# Patient Record
Sex: Female | Born: 1965 | Race: White | Hispanic: No
Health system: Southern US, Community
[De-identification: ages and names within clinical notes are randomized; demographics above are authoritative.]

---

## 2000-07-30 ENCOUNTER — Ambulatory Visit (HOSPITAL_COMMUNITY): Admission: RE | Admit: 2000-07-30 | Discharge: 2000-07-30 | Payer: Self-pay | Admitting: Family Medicine

## 2000-07-30 ENCOUNTER — Encounter: Payer: Self-pay | Admitting: Family Medicine

## 2003-05-05 ENCOUNTER — Ambulatory Visit (HOSPITAL_COMMUNITY): Admission: RE | Admit: 2003-05-05 | Discharge: 2003-05-05 | Payer: Self-pay | Admitting: Obstetrics and Gynecology

## 2003-06-07 ENCOUNTER — Ambulatory Visit (HOSPITAL_COMMUNITY): Admission: RE | Admit: 2003-06-07 | Discharge: 2003-06-07 | Payer: Self-pay | Admitting: Obstetrics and Gynecology

## 2003-11-13 ENCOUNTER — Emergency Department (HOSPITAL_COMMUNITY): Admission: EM | Admit: 2003-11-13 | Discharge: 2003-11-13 | Payer: Self-pay | Admitting: Emergency Medicine

## 2006-05-27 ENCOUNTER — Emergency Department (HOSPITAL_COMMUNITY): Admission: EM | Admit: 2006-05-27 | Discharge: 2006-05-27 | Payer: Self-pay | Admitting: Emergency Medicine

## 2006-10-19 ENCOUNTER — Ambulatory Visit (HOSPITAL_COMMUNITY): Admission: RE | Admit: 2006-10-19 | Discharge: 2006-10-19 | Payer: Self-pay | Admitting: Otolaryngology

## 2006-12-17 ENCOUNTER — Ambulatory Visit (HOSPITAL_COMMUNITY): Admission: RE | Admit: 2006-12-17 | Discharge: 2006-12-17 | Payer: Self-pay | Admitting: Otolaryngology

## 2006-12-23 ENCOUNTER — Ambulatory Visit: Payer: Self-pay | Admitting: Gastroenterology

## 2006-12-28 ENCOUNTER — Ambulatory Visit (HOSPITAL_COMMUNITY): Admission: RE | Admit: 2006-12-28 | Discharge: 2006-12-28 | Payer: Self-pay | Admitting: Family Medicine

## 2006-12-30 ENCOUNTER — Ambulatory Visit (HOSPITAL_COMMUNITY): Admission: RE | Admit: 2006-12-30 | Discharge: 2006-12-30 | Payer: Self-pay | Admitting: Gastroenterology

## 2006-12-30 ENCOUNTER — Encounter: Payer: Self-pay | Admitting: Gastroenterology

## 2006-12-30 ENCOUNTER — Ambulatory Visit: Payer: Self-pay | Admitting: Gastroenterology

## 2007-02-27 ENCOUNTER — Inpatient Hospital Stay (HOSPITAL_COMMUNITY): Admission: RE | Admit: 2007-02-27 | Discharge: 2007-02-28 | Payer: Self-pay | Admitting: Surgery

## 2007-03-17 ENCOUNTER — Encounter: Admission: RE | Admit: 2007-03-17 | Discharge: 2007-03-17 | Payer: Self-pay | Admitting: Surgery

## 2007-10-05 ENCOUNTER — Inpatient Hospital Stay (HOSPITAL_COMMUNITY): Admission: EM | Admit: 2007-10-05 | Discharge: 2007-10-07 | Payer: Self-pay | Admitting: Emergency Medicine

## 2007-10-07 ENCOUNTER — Ambulatory Visit: Payer: Self-pay | Admitting: *Deleted

## 2007-10-07 ENCOUNTER — Inpatient Hospital Stay (HOSPITAL_COMMUNITY): Admission: RE | Admit: 2007-10-07 | Discharge: 2007-10-14 | Payer: Self-pay | Admitting: *Deleted

## 2008-12-03 IMAGING — RF DG UGI W/ HIGH DENSITY W/KUB
15 of 16 series · 15 of 16 positions shown · non-contrast
Comparison: none

CLINICAL DATA: Laryngopharyngeal reflux. 
 DIAGNOSTIC UPPER G.I. WITH HIGH DENSITY WITH KUB:

[Series 1: run · 1 of 1 slices shown (1 of 14)]
[im 1/1]
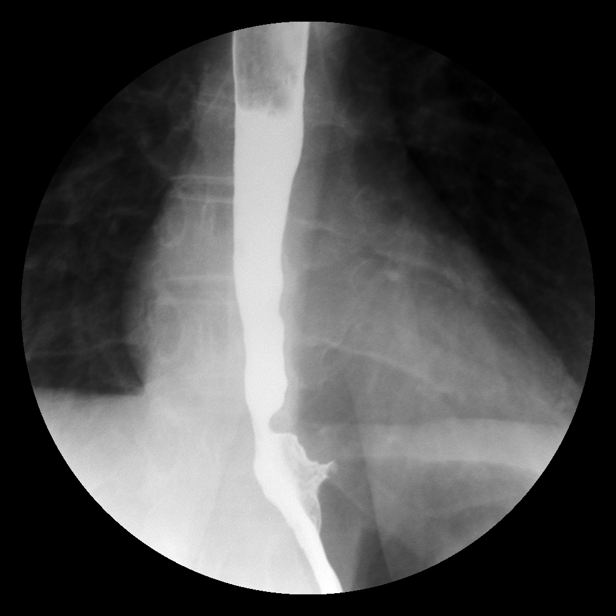

[Series 2: run · 1 of 1 slices shown (2 of 14)]
[im 1/1]
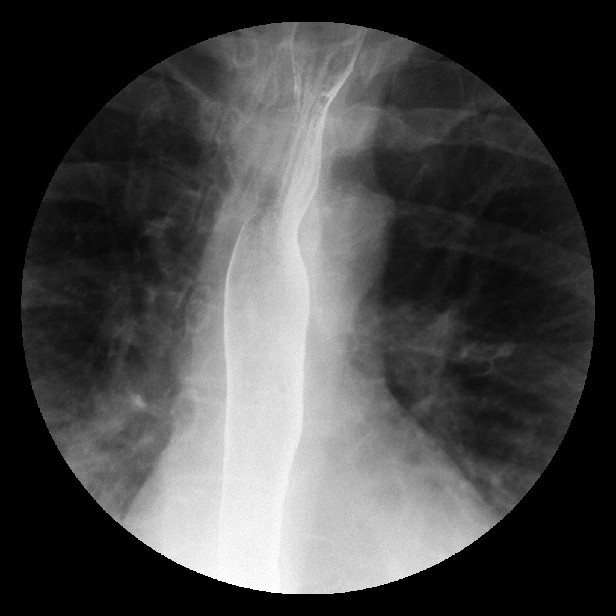

[Series 3: run · 1 of 1 slices shown (3 of 14)]
[im 1/1]
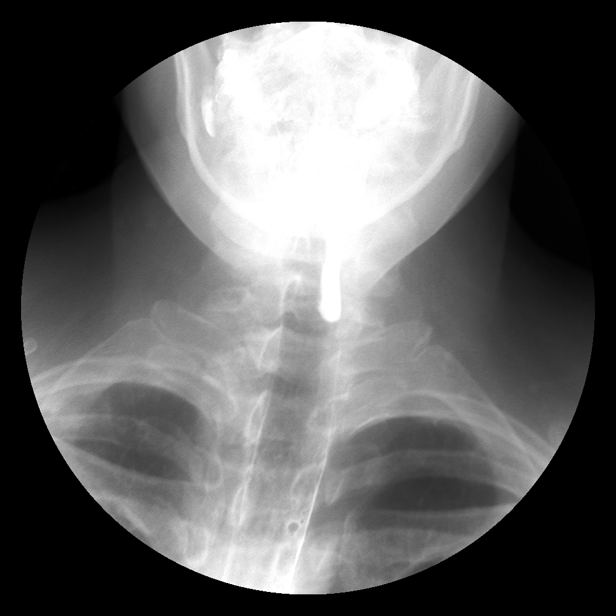

[Series 4: run · 1 of 1 slices shown (4 of 14)]
[im 1/1]
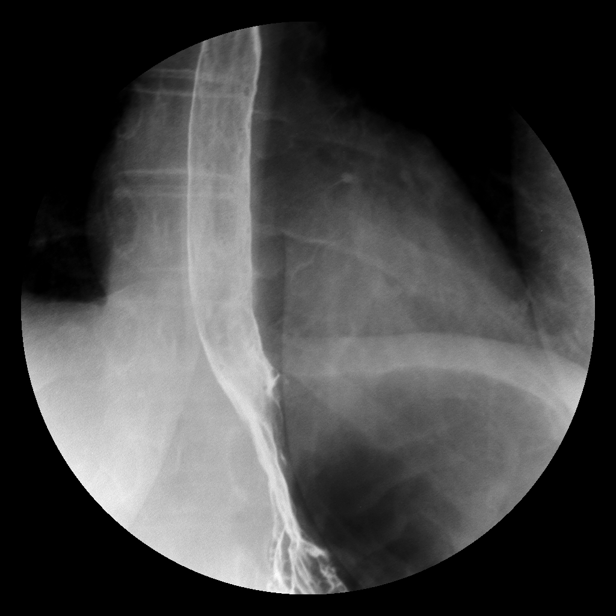

[Series 5: run · 1 of 1 slices shown (5 of 14)]
[im 1/1]
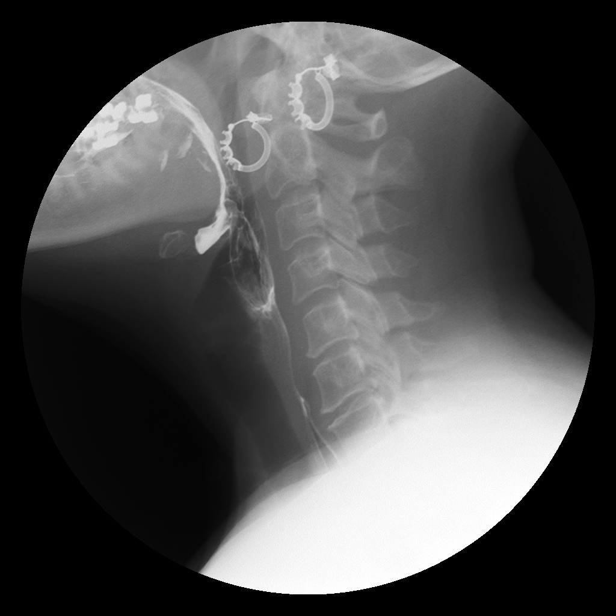

[Series 6: run · 1 of 1 slices shown (6 of 14)]
[im 1/1]
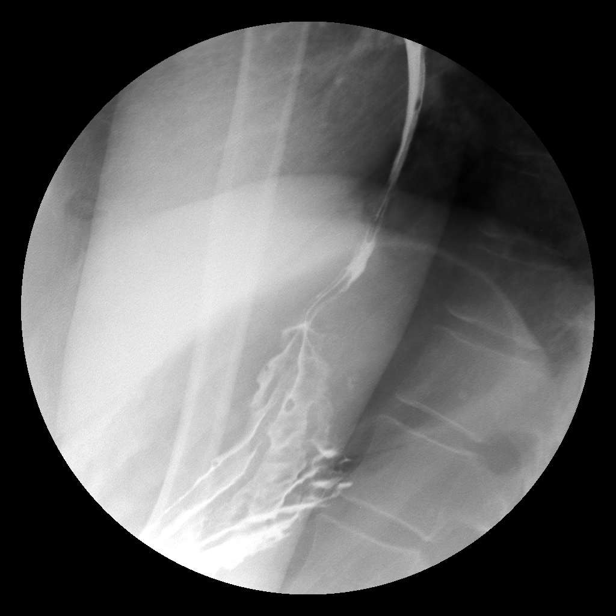

[Series 7: run · 1 of 1 slices shown (7 of 14)]
[im 1/1]
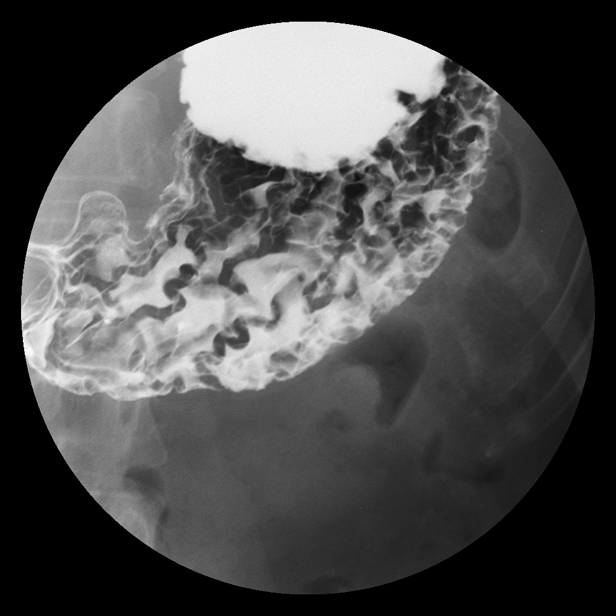

[Series 9: run · 1 of 1 slices shown (8 of 14)]
[im 1/1]
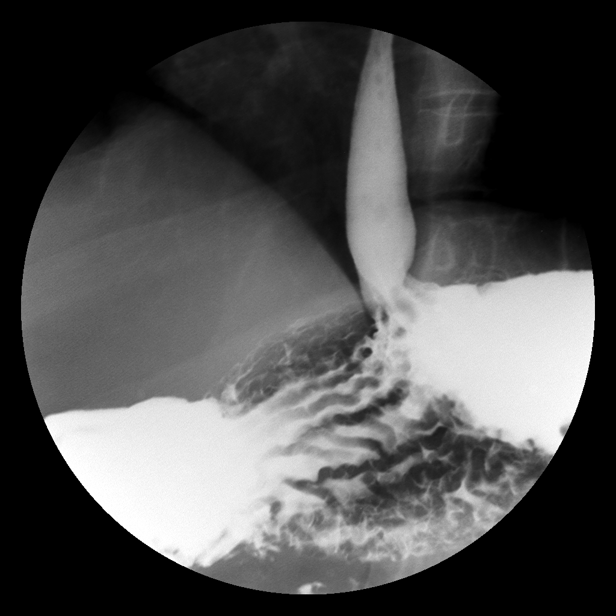

[Series 10: run · 1 of 1 slices shown (9 of 14)]
[im 1/1]
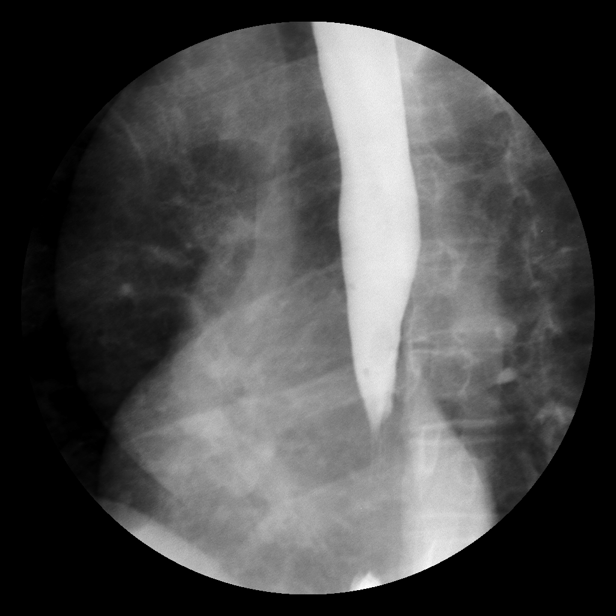

[Series 11: run · 1 of 1 slices shown (10 of 14)]
[im 1/1]
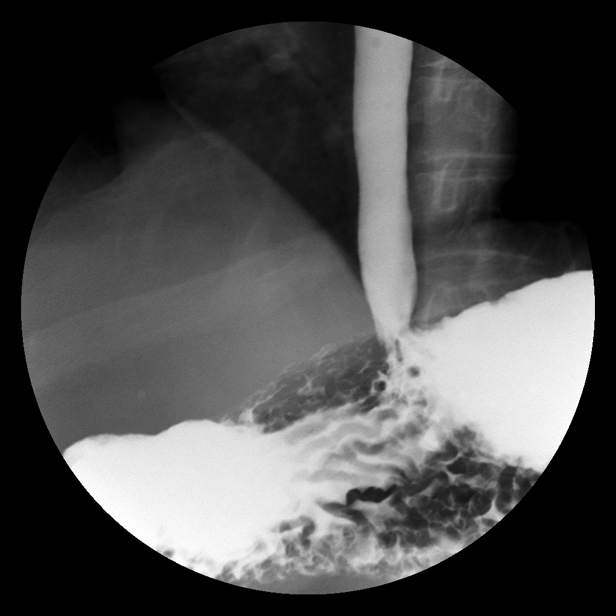

[Series 12: run · 1 of 1 slices shown (11 of 14)]
[im 1/1]
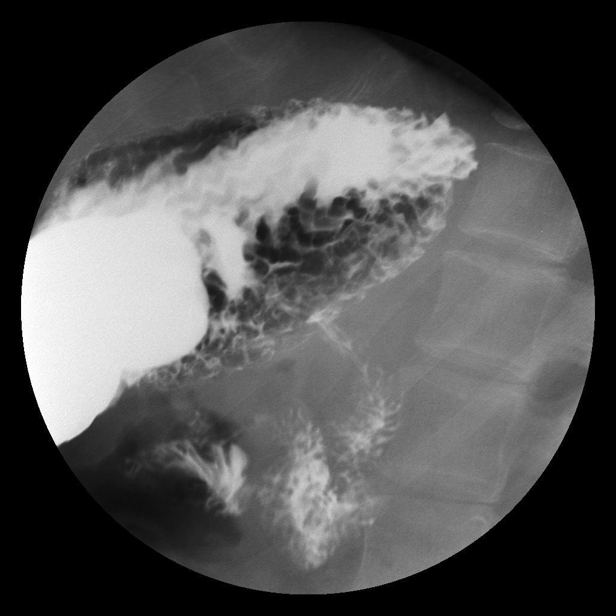

[Series 13: run · 1 of 1 slices shown (12 of 14)]
[im 1/1]
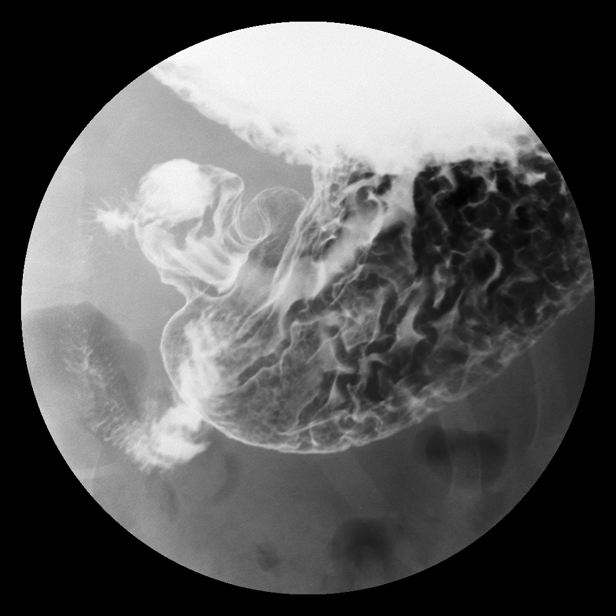

[Series 14: run · 1 of 1 slices shown (13 of 14)]
[im 1/1]
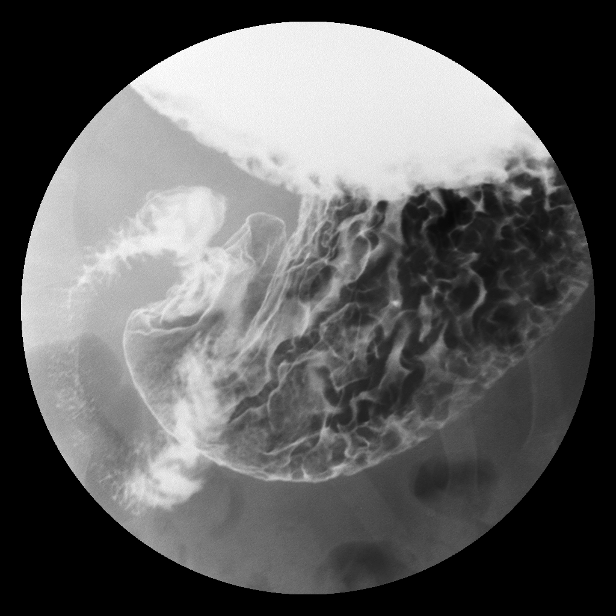

[Series 15: run · 1 of 1 slices shown (14 of 14)]
[im 1/1]
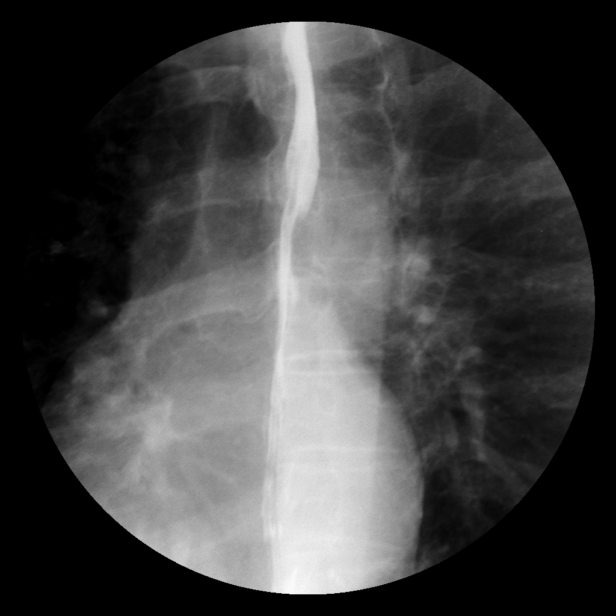

[Series 1001: view not recorded · 0.20mm/px · 1 of 1 slices shown]
[im 1/1]
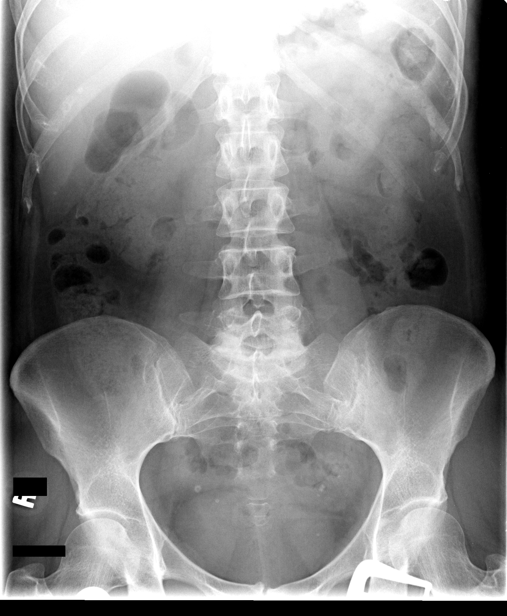

[15 of 16 positions shown; findings below may reference images not displayed]

FINDINGS: Preliminary scout film is unremarkable.  Normal bowel gas pattern.  Swallowing function appeared normal.  Symmetrical vallecula and piriform sinuses.  Normal esophageal motility.  Normal primary stripping wave.  No tertiary contractions.  No esophageal stricture.  Patient swallowed a 13 mm barium tablet in the erect position.  Tablet traversed the esophagus and entered the stomach without delay.  Minimal hiatal hernia.  There was marked GE reflux at fluoroscopy.  Barium refluxed from the stomach up into the proximal esophagus.  
 Diffusely the gastric mucosal folds appear enlarged and tortuous.  Prominent areae gastricae mucosal pattern of the gastric antrum is also noted.  The findings are compatible with gastritis.  No gastric or duodenal ulcer.
IMPRESSION: Minimal hiatal hernia.  Marked GE reflux.  Findings compatible with marked changes of gastritis.

## 2010-04-14 ENCOUNTER — Encounter: Payer: Self-pay | Admitting: Family Medicine

## 2010-08-06 NOTE — Consult Note (Signed)
NAMEMACALL, MCCROSKEY              ACCOUNT NO.:  [id number]   MEDICAL RECORD NO.:  [id number]          PATIENT TYPE:  AMB   LOCATION:  DAY                           FACILITY:  [facility]   PHYSICIAN:  Kassie Mends, M.D.      DATE OF BIRTH:  1965-05-06   DATE OF CONSULTATION:  12/23/2006  DATE OF DISCHARGE:                                 CONSULTATION   REQUESTING PHYSICIAN:  Dr. Milinda Cave.   OTOLARYNGOLOGIST:  Dr. Christia Reading in [place].   CHIEF COMPLAINT:  Refractory GERD.   HISTORY OF PRESENT ILLNESS:  Ms. Muldrow is a 45 year old Caucasian female  who has longstanding history of chronic GERD for about two years now.  She complains of daily heartburn, indigestion and water brash.  She has  been seen by Dr. Jenne Pane, otolaryngologist out of [facility], and has been  placed on Zegerid b.i.d. as well as Zantac 300 mg every night.  She is  still having refractory symptoms.  She is going to be seen by Dr. Corliss Skains  with helpful anti-reflux surgery.  She tells me she has had a pH study  done through Dr. Jenne Pane' office although I do not have this report.  She  has also been experiencing laryngitis and frequent throat clearing.  She  has no problems with odynophagia.  She does notice dysphagia but only  with solid foods.  She feels as though she has to clear these foods with  a drink of water after eating.  She does have history of hiatal hernia.  She complains of lower abdominal cramp-like pain and has also noticed  bright red blood in her stools as well as on the toilet paper in  moderate to large amounts.   PAST MEDICAL AND SURGICAL HISTORY:  1. Hypothyroidism.  2. Chronic GERD.  3. Anxiety.  4. Bipolar disorder.  5. Hiatal hernia.  6. Tubal ligation.  7. She has had left leg surgery and sinus surgery.   CURRENT MEDICATIONS:  1. Zegerid 40 mg b.i.d.  2. Zantac 300 mg every night.  3. Levothyroxine 75 mcg daily.  4. Remeron 30 mg daily.  5. Nortrel 1/35 mg daily.  6. Valium 10 mg  p.r.n.  7. Tylenol p.r.n.   ALLERGIES:  CODEINE.   FAMILY HISTORY:  There is no known family history of colorectal  carcinoma, liver or chronic GI problems.   SOCIAL HISTORY:  Ms. Bently is married.  She has three healthy children.  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  She  smokes about a half a pack to one pack of cigarettes a day.  She denies  any alcohol or drug use.   REVIEW OF SYSTEMS:  See HPI.  Otherwise negative.   PHYSICAL EXAMINATION:  VITAL SIGNS:  Weight 153.5 pounds, height 66  inches, temperature 98.1, blood pressure 102/66 and pulse 72.  GENERAL:  Ms. Menees is a 41-year Caucasian female who is alert,  oriented, pleasant, in no acute distress.  HEENT.  Sclerae clear and nonicteric.  Conjunctivae pink.  Oropharynx  pink and moist without lesions.  NECK:  Supple without any masses or thyromegaly.  CHEST:  Heart regular rate and rhythm.  Normal S1-S2.  No murmurs,  clicks, rubs or gallop.  LUNGS:  Clear to auscultation bilaterally.  ABDOMEN:  Positive bowel sounds x4.  No bruits auscultated.  Soft,  nontender, nondistended without palpable mass or hepatosplenomegaly.  No  rebound tenderness or guarding.  EXTREMITIES:  Without clubbing or edema bilaterally.   IMPRESSION:  Ms. Mccarey is a 41-year Caucasian female with an approximate  2-year history of refractory GERD symptoms despite b.i.d. PPI and  evening H2 blocker.  She tells me she had an abnormal pH probe study  through Dr. Jenne Pane' office and is requesting further evaluation with  possible treatment via anti-reflux surgery.   She also has history of intermittent hematochezia which I suspect is due  to benign anorectal source.  However, given the chronicity of her  symptoms which have occurred over the last 1-2 years, I feel she should  have further evaluation to rule out polyps or colorectal carcinoma.   PLAN:  1. EGD and colonoscopy with Dr. Cira Servant in the near future.  I discussed       the procedure including risks and benefits which include but are      not limited to bleeding, infection, perforation, drug reaction.      She agreed to this plan and consent will be obtained.  2. She should continue Zantac 40 mg b.i.d. and Zegerid 300 mg every      night for now.  3. We will fax records to Dr. Corliss Skains in [place] for further      evaluation if she is a candidate for anti-reflux surgery.     We would like to thank Dr. Milinda Cave for allowing [place] to participate in the  care of Ms. Vlachos.      Lorenza Burton, N.P.      Kassie Mends, M.D.  Electronically Signed    KJ/MEDQ  D:  12/23/2006  T:  12/24/2006  Job:  [id number]   cc:   Antony Contras, MD  Fax: [phone]   Wilmon Arms. Corliss Skains, M.D.  [address] [place] [address] [address]  [place]

## 2010-08-06 NOTE — H&P (Signed)
[name], [name]              ACCOUNT NO.:  [id number]   MEDICAL RECORD NO.:  [id number]          PATIENT TYPE:  EMS   LOCATION:  ED                            FACILITY:  [facility]   PHYSICIAN:  Skeet Latch, DO    DATE OF BIRTH:  1965-04-30   DATE OF ADMISSION:  10/05/2007  DATE OF DISCHARGE:  LH                              HISTORY & PHYSICAL   PRIMARY CARE PHYSICIAN:  Jeoffrey Massed, MD.   HISTORY OF PRESENT ILLNESS:  This is a 45 year old female who presented  per EMS with overdose.  The history is obtained per chart.  The patient  is very lethargic and will not give a history at this time.  Apparently  EMS was called to her home by boyfriend who states the patient has been  drinking.  She had 5 beers and took unknown quantity of Valium, could be  approximately 40-50 pills.  Her bottle was empty when EMS arrived.  Apparently the patient is on Valium, Remeron, Depakote, Synthroid.  She  has a history of anxiety, bipolar disorder, depression, hypothyroidism  and panic attacks.  Apparently the patient took these pills last night.  She has been in the emergency room most of the evening.  The patient's  blood pressure has been fairly low.  She is receiving IV hydration and  her pulse has been fluctuating.  At this time, the patient is still very  lethargic, hypotensive, slightly bradycardic and needs admission at this  time.  ACT team has seen the patient and states that once patient is  stable, she will be transferred to behavioral health.  The patient did  have multiple beers with the overdose.   PAST MEDICAL HISTORY:  1. Anxiety.  2. Bipolar disorder.  3. Depression.  4. Hypothyroidism.  5. Panic attacks.   FAMILY HISTORY:  Unremarkable.   PAST SURGICAL HISTORY:  None.   SOCIAL HISTORY:  She is a smoker.  No history of drug or alcohol abuse.   CONTRACEPTION:  She takes birth control pills.   ALLERGIES:  CODEINE.   HOME MEDICATIONS:  1. Synthroid 75 mcg once a  day.  2. Valium 10 mg twice daily.  3. Remeron 30 mg once a day.  4. Depakote 250 mg twice daily.  5. Diclofenac 75 mg once a day.  6. Zegerid 40 daily.  7. The patient does take a birth control pill.   REVIEW OF SYSTEMS:  Unable to assess.   PHYSICAL EXAMINATION:  LAST VITAL SIGNS:  She is sating 98%, blood  pressure 91/51, pulse 61.  CONSTITUTIONAL:  Well-developed, well-nourished, well-hydrated,  lethargic.  HEENT:  Head is atraumatic, normocephalic.  Eyes:  Pupils are dilated.  NECK:  Soft, supple, nontender, nondistended.  CARDIOVASCULAR:  Slightly bradycardic, regular rhythm.  No murmurs, rubs  or gallops.  LUNGS:  Clear to auscultation bilaterally.  No wheezes, rales or  rhonchi.  ABDOMEN:  Soft, nontender and nondistended.  Positive bowel sounds.  No  rigidity or guarding.  EXTREMITIES:  No clubbing, cyanosis, or edema.  NEUROLOGIC:  Cranial nerves II-XII are grossly intact.  The  patient can  move all extremities.  PSYCHIATRIC:  Flat affect.  She is very lethargic at this time.   LABORATORY DATA:  Urinalysis positive for nitrites.  Depakote level is  less than 10.  Salicylate level less than 4.  Acetaminophen level less  than 10.  Sodium 137, potassium 3.9, chloride 105, CO2 24, glucose 71,  BUN 6, creatinine 0.76, alcohol level was 97, white count 13.3,  hemoglobin 14.1, hematocrit 41.5, platelet count 211.  Urine pregnancy  test was negative.  Urine drug screen was positive for benzodiazepines,  opiates and cannabinoids.   DIAGNOSTICS:  CT of her head without contrast showed no acute or focal  intracranial abnormalities.  Mild chronic maxillary sinusitis.   ASSESSMENT:  1. Benzodiazepine drug overdose.  2. Hypotension.  3. Urinary tract infection.  4. History of bipolar disorder.  5. History of anxiety.  6. History of depression.  7. History of hypothyroidism.   PLAN:  1. The patient will be admitted to the ICU at this time.  2. For her drug overdose,  supportive care management will be in place      at this time.  3. We will try to keep her O2 sats above or greater than 90%.  4. The patient will get a repeat CBC, BMET, ethanol level, TSH and we      will get an EKG.  The patient has already had a negative CT scan of her head.  1. The patient will be kept n.p.o. until she is more alert.  2. She will have a clear liquid diet, advance as tolerated.  3. For hypotension probably secondary to her benzodiazepine use      associated with her alcohol ingestion.  The patient will be given      IV fluid hydration.  We will monitor her blood pressures on a      regular basis.  4. For urinary tract infection, we will start patient on oral      antibiotics at this time.  If she is not alert, then we will      switch to IV and wait for urine culture and sensitivity.  5. ACT team has already seen the patient.  She will be transferred to      behavioral health services once she is more stable and her blood      pressure returns to normal levels.      Skeet Latch, DO  Electronically Signed     SM/MEDQ  D:  10/05/2007  T:  10/05/2007  Job:  [id number]   cc:   Jeoffrey Massed, MD  Fax: [phone]

## 2010-08-06 NOTE — Op Note (Signed)
[name], [name]              ACCOUNT NO.:  [id number]   MEDICAL RECORD NO.:  [id number]          PATIENT TYPE:  OIB   LOCATION:  0098                         [facility]AC[facility]   PHYSICIAN:  [name], M.D. DATE OF BIRTH:  1965/09/26   DATE OF PROCEDURE:  02/26/2007  DATE OF DISCHARGE:                               OPERATIVE REPORT   PREOP DIAGNOSIS:  Refractory gastroesophageal reflux disease.   POSTOPERATIVE DIAGNOSIS:  Refractory gastroesophageal reflux disease.   PROCEDURE PERFORMED:  Laparoscopic hiatal hernia repair and Nissen  fundoplication.   SURGEON:  [name], M.D. ,FACS   ASSISTANT:  [name], M.D.   ANESTHESIA:  General endotracheal.   INDICATIONS:  The patient is a 45 year old female who presents with a 4-  year history of severe gastroesophageal reflux with some regurgitation.  She has been aggressively treated medically for this reflux with minimal  improvement.  She has been evaluated by Dr. Christia Chandler.  A pH probe  showed a significant amount of reflux.  She also has a minimal hiatal  hernia.  Endoscopy showed some minimal gastritis.  No sign of Barrett's  esophagus.  The patient has a chronic hoarse voice.  She presents,  today, for elective Nissen fundoplication.   DESCRIPTION OF PROCEDURE:  The patient was brought to the operating  room, placed in the supine position on the operating room table.  After  an adequate level of general anesthesia was obtained, a Foley catheter  was placed under sterile technique.  She was placed in lithotomy  position in yellow fin stirrups.  Her abdomen was prepped with Betadine,  and draped in a sterile fashion.   A time-out was taken to assure the proper patient and proper procedure.  We inserted the 11 mm OptiVu port into the peritoneal cavity through a  small incision superior into the left of the umbilicus.  We insufflated  CO2 maintaining maximal pressure of 15 mmHg.  The patient was  positioned  in reverse Trendelenburg.  A 5 mm port was placed in subxiphoid  position.  This was then exchanged for a Nathanson's retractor which was  attached to the bed.  This was used to elevate the liver, exposing the  hiatus.  An 11-mm port was placed in the right upper quadrant in the  midclavicular line.  Another 11 mm port was placed in the left upper  quadrant in the midclavicular line.  An additional 11 mm port was placed  in the left anterior axillary line.   The harmonic scalpel was used to open the gastrohepatic ligament.  We  continued this up until we had exposed the entire right crus.  We were  able to dissect across the anterior part of the hiatus.  Blunt  dissection was used to open the hiatus and dissect the esophagus away  from the crura.  We then took down the short gastric vessels with the  harmonic scalpel, along the greater curvature of the stomach.  We  continued this up near the spleen.  The stomach was in close proximity  to the spleen, but we  were able to dissect this away without causing any  splenic injury.  We continued dissecting this up along the left crus.   We were able to bluntly dissect the stomach and esophagus away from the  crura.  We were able to expose the retroesophageal window.  A Penrose  drain was passed into the retroesophageal window, and used to retract  the stomach inferiorly.  We were able to completely expose the hiatus.  The hiatus did appear loose, but there was minimal herniation.  We then  tightened up the hiatus with two pledgeted sutures of 0-0 Surgitek with  the Endo stitch device.  A 50-French bougie was then passed by  anesthesia.  We passed the stomach through the retroesophageal window to  create our wrap.  We created a 3 cm length wrap with 3 interrupted #0  Surgitek sutures with no pledgets.  Small bites of the esophagus were  also taken.  The bougie was then removed.   The abdomen was then thoroughly irrigated and  inspected for hemostasis.  Pneumoperitoneum was then released.  The ports were removed.  4-0  Monocryl was used to close the skin incisions.  The Foley catheter was  removed.  The patient was then extubated and brought to recovery room in  stable condition.  All sponge, instrument, and needle counts were  correct.      [name], M.D.  Electronically Signed     MKT/MEDQ  D:  02/26/2007  T:  02/26/2007  Job:  [id number]   cc:   Kimberly Contras, MD  Fax: [phone]

## 2010-08-06 NOTE — Op Note (Signed)
NAMELUCINA, [name]              ACCOUNT NO.:  [id number]   MEDICAL RECORD NO.:  [id number]          PATIENT TYPE:  AMB   LOCATION:  DAY                           FACILITY:  [facility]   PHYSICIAN:  Kassie Mends, M.D.      DATE OF BIRTH:  [birth year]   DATE OF PROCEDURE:  12/30/2006  DATE OF DISCHARGE:                               OPERATIVE REPORT   REFERRING PHYSICIAN:  Jeoffrey Massed, MD   PROCEDURE:  1. Colonoscopy with cold forceps polypectomy.  2. Esophagogastroduodenoscopy   INDICATIONS FOR EXAM:  [name] is a 45 year old female who presents  with intermittent rectal bleeding.  She also has gastroesophageal reflux  disease and apparently had a pH study done by Dr. Jenne Pane that showed  evidence of uncontrolled gastroesophageal reflux disease.  She presents  with difficulty swallowing solids food.  She continues to complain of  daily heartburn, indigestion and water brash even on Zegerid and Zantac.   FINDINGS:  1. Five 3-4 mm polyps seen in the colon.  She had two polyps in the      transverse colon, and two in the sigmoid colon, one in the      rectosigmoid area.  All polyps were removed via cold forceps.      Otherwise she had no masses, inflammatory changes, diverticula or      arteriovenous malformations.  2. Normal retroflexed view of the rectum.  3. Normal esophagus without evidence of Barrett's erosions, mass or      ulcerations.  4. Normal stomach without evidence of a hiatal hernia.  5. Normal duodenal bulb and second portion of the duodenum.   DIAGNOSIS:  1. No source for her rectal bleeding was identified.  The polyps were      not large enough to be contributing to her symptoms.  2. No evidence of erosive esophagitis.   RECOMMENDATIONS:  1. She may continue Zegerid and Zantac.  2. Will send pictures from the upper endoscopy to Dr. Manus Rudd.  3. No aspirin, NSAIDs or anticoagulation for seven days.  4. Will call her with results of her biopsies.  5. She  should follow high fiber diet.  She is given a handout on high-      fiber diet and polyps.  6. If her polyps are adenomatous, then she should have a screening      colonoscopy in 5 years and her first-degree relatives should have      screening colonoscopy at age 81 and then every 5 years.   MEDICATIONS:  1. Demerol 100 mg IV.  2. Versed 10 mg IV.  3. Phenergan 12.5 mg IV.   PROCEDURE TECHNIQUE:  Physical exam was performed.  Informed consent was  obtained from the patient after explaining benefits, risks and  alternatives to the procedure.  The patient was connected to monitor and  placed in left lateral position.  Continuous oxygen was provided by  nasal cannula and IV medicine administered through an indwelling  cannula.  After administration of sedation and rectal exam, the  patient's rectum was intubated and the scope was advanced under  direct  visualization to the cecum.  The scope was removed slowly by carefully  examining the color, texture, anatomy and integrity of the mucosa on the  way out.   After the colonoscopy, the patient's esophagus was intubated with a  diagnostic gastroscope and the scope was advanced under direct  visualization to the second portion of the duodenum.  The scope was  removed slowly by carefully examining color, texture, anatomy and  integrity of the mucosa on the way out.  The patient was recovered in  endoscopy and discharged home in satisfactory condition.      Kassie Mends, M.D.  Electronically Signed     SM/MEDQ  D:  12/30/2006  T:  12/30/2006  Job:  [id number]   cc:   Jeoffrey Massed, MD  Fax: [phone]   Antony Contras, MD  Fax: [phone]   Wilmon Arms. Corliss Skains, M.D.  [address] [place] [phone]  [place]

## 2010-08-06 NOTE — Discharge Summary (Signed)
[name], [name]              ACCOUNT NO.:  [id number]   MEDICAL RECORD NO.:  [id number]          PATIENT TYPE:  INP   LOCATION:  A306                          FACILITY:  [facility]   PHYSICIAN:  Osvaldo Shipper, MD     DATE OF BIRTH:  1965/04/01   DATE OF ADMISSION:  10/04/2007  DATE OF DISCHARGE:  07/16/2009LH                               DISCHARGE SUMMARY   Please see H and P dictated by Dr. Edsel Petrin for details regarding  patient's presenting illness.   The patient's PMD is Dr. Santiago Bumpers.   DISCHARGE DIAGNOSES:  1. Intentional drug overdose.  2. History of bipolar disorder and depression.  3. History of hypothyroidism.  4. History of anxiety and panic attacks.   BRIEF HOSPITAL COURSE:  The patient is a 45 year old Caucasian female  who had consumed about 5 beers at home and she took an unknown quantity  of valium when she was brought into the emergency department 2 days ago.  The patient is lethargic, hypotensive in the emergency department.  She  was admitted for further evaluation.  She was monitored initially in the  intensive care unit.  She was given fluids.  She slowly improved.  Her  mentation improved, but she then became really agitated and she wanted  to go out of the hospital.  We had to give her some doses of Haldol to  calm her down.  This morning patient is still sleepy, somnolence likely  the result of her medications including the Haldol that she has been  getting.  She, otherwise, is arousable and is oriented to place.  She  denies any complaints, no headaches.   PHYSICAL EXAMINATION:  VITAL SIGNS:  Temperature 98.2, heart rate 57,  respiratory rate 18, blood pressure 93/57 this morning.  HEENT:  Her pupils are equal and reactive.  LUNGS:  Clear to auscultation bilaterally.  CARDIOVASCULAR:  S1, S2, is normal, regular.  ABDOMEN:  Soft.  EXTREMITIES:  Extremities show no edema.   LABS:  Unremarkable this morning.   She did have high alcohol  level when she presented 2 days ago, but that  has also normalized.  She had elevated white count and was found to have  a possible UTI for which she was put on Cipro, and her white count has  also normalized.  Her urine drug screen was positive for benzo and  marijuana.  Her urine pregnancy was negative.   She did have a CT head which did not show any acute intracranial  abnormalities.   So, I think the patient predominantly needs psychiatric treatment at  this time in an inpatient setting.  We have medically cleared her as of  now.  We are awaiting the behavioral health team to come and assist [place]  with discharging this patient to a behavioral health facility.   DISCHARGE MEDICATIONS:  1. Cipro 250 mg p.o. b.i.d. for 2 more days.  2. Synthroid 75 mcg p.o. daily.   I would defer reinitiation of valium, Remeron, Depakote to the  psychiatrist.  She can continue her Zegerid and blood control pills as  before.  I would encourage her not to use diclofenac on a regular basis.   DIET:  Regular diet.   PHYSICAL ACTIVITY:  No restrictions.   Further disposition and discharge planning to be decided at behavioral  health.      Osvaldo Shipper, MD  Electronically Signed     GK/MEDQ  D:  10/07/2007  T:  10/07/2007  Job:  [id number]   cc:   Jeoffrey Massed, MD  Fax: [phone]

## 2010-08-09 NOTE — Discharge Summary (Signed)
[name], [name]              ACCOUNT NO.:  [id number]   MEDICAL RECORD NO.:  [id number]          PATIENT TYPE:  IPS   LOCATION:  0606                          FACILITY:  [facility]   PHYSICIAN:  Anselm Jungling, MD  DATE OF BIRTH:  [birth year]   DATE OF ADMISSION:  10/07/2007  DATE OF DISCHARGE:  10/14/2007                               DISCHARGE SUMMARY   IDENTIFYING DATA/REASON FOR ADMISSION:  The patient is a 45 year old  married white female admitted due to depression, and suicide attempt.  She had overdosed on pills and alcohol.  Please refer to the admission  note for further details pertaining to the symptoms, circumstances and  history that led to her hospitalization.  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   MEDICAL/LABORATORY:  The patient was treated for her overdose in the  medical setting, prior to transfer to the Inpatient Psychiatric service.  Once transferred to [place], she was reassessed medically and physically by  the Psychiatric Nurse Practitioner.  The patient was continued on a  further 2 days of Cipro, which had been initiated in the medical  setting.  She was treated with her usual Nortrel, Zegerid, and  levothyroxine.  There were no acute medical issues.   HOSPITAL COURSE:  The patient was admitted to the Adult Inpatient  Psychiatric service.  She presented as a well-nourished, normally-  developed woman who initially remained in bed and was minimally  responsive, guarded, withdrawn, and was unwilling to give any meaningful  history.  She remained guarded and withdrawn on the second hospital day  and appeared quite depressed.  She was somewhat evasive as to whether or  not her husband was aware that she was in the hospital.  She did give [place]  consent to contact him, which we attempted to do immediately.   On the third hospital day, the patient was up, out of bed, dressed,  groomed, and active in the therapeutic milieu.  Her  affect was brighter  and she was smiling some.  She was not dysphoric of mood any longer.  Eating and sleeping a little better.  She evidenced more optimism.  Because of this, we felt that she was appropriate for a transfer to the  more high functioning 600 unit hall.   However, on the following morning, I found the patient again remaining  in bed during group time.  She stated her reason for doing so was to  keep her mind clear of bad thoughts that she was having.  She was  encouraged to participate.   On the following day, the patient indicated to me in our one-to-one  session that she was having more in the way of suicidal thoughts.  Because of this, she was transferred back to the Intensive Care Unit for  approximately 24 hours.  At the end at that time, she presented me with  a written safety contract, that was quite well done, and appeared very  sincere and convincing.  She spoke with me much more openly, non-  defensively, and without the guarding that had  been present earlier  during her stay.  It appeared that she had made a significant advance  with respect to her mood, and  her receptivity to treatment and forging  the therapeutic alliance with [place].   She had received a small dose of Haldol 5 mg at bedtime, which had been  very helpful in reducing agitation and improving her sleep.   On the eighth hospital day, there was a family session involving the  patient and her sister.  The patient did not allow her husband to come  to the session, citing his being primarily a [place] speaker and not  communicating well in [place].  We had offered to provide a Spanish-  speaking interpreter, but this did not interest her.  We reviewed with  the patient and her sister her history of depression, and her treatment  and aftercare needs.  The patient indicated that she wanted individual  therapy, which she had never had in the past, and she reported believing  that she had significant  past issues to deal with.  The patient also  agreed to continue the trial of Cymbalta 60 mg daily that had been  initiated, and that she was tolerating it well up to that point.   The patient's sister was very supportive, and both the patient and her  sister were pleased with the possibility of her being discharged that  day.  The patient denied any further suicidal ideation, and agreed to  the following aftercare plan.   AFTERCARE:  The patient was to follow-up at [facility] in  [place] with an appointment on October 18, 2007 at 9:00 a.m.Marland Kitchen  She was  also to follow-up with Otelia Limes for individual therapy.   DISCHARGE MEDICATIONS:  1. Cymbalta 60 mg daily.  2. levothyroxine 75 mcg daily.  3. Nortrel as prescribed.  4. Zegerid as prescribed.   DISCHARGE DIAGNOSES:  AXIS I: Depressive disorder, not otherwise  specified.  AXIS II: Deferred.  AXIS III: History of hypothyroidism.  AXIS IV: Stressors severe.  AXIS V: GAF on discharge 55.      Anselm Jungling, MD  Electronically Signed     SPB/MEDQ  D:  11/03/2007  T:  11/03/2007  Job:  [id number]

## 2010-08-09 NOTE — H&P (Signed)
NAME:  [name], [name]                          ACCOUNT NO.:  [id number]   MEDICAL RECORD NO.:  [id number]                  PATIENT TYPE:   LOCATION:                                       FACILITY:   PHYSICIAN:  Tilda Burrow, M.D.              DATE OF BIRTH:   DATE OF ADMISSION:  DATE OF DISCHARGE:                                HISTORY & PHYSICAL   CHIEF COMPLAINT:  1. Desire for elective permanent sterilization.  2. History of long-term Depo-Provera usage.   HISTORY OF PRESENT ILLNESS:  This 45 year old female, gravida 3, para 3 on  Depo-Provera through the [facility] Department has been  referred to our office for laparoscopic tubal sterilization.  The patient  has received discussion of the procedure with the permanency emphasized with  the failure rate of 1 in 100.  The patient understands the technical aspects  of the procedure and will have procedure performed at noon on June 07, 2003.   PAST MEDICAL HISTORY:  Benign.   SOCIAL HISTORY:  Divorced.  Stable relationship, no plans for future  children.   SURGICAL HISTORY:  1. Scar right forehead at 18 months.  2. Tubular rod placement for fracture of the leg from roller skating injury     1997.  3. Tonsillectomy as a child.   ALLERGIES:  None.  Side effect--CODEINE causes abdominal cramping, nausea  and vomiting.   SOCIAL HISTORY:  Habits:  Cigarettes 1/2 pack per day.  Alcohol,  recreational drugs:  Denied.   MEDICATIONS:  Depo-Provera.   PHYSICAL EXAMINATION:  GENERAL:  General exam shows a healthy Caucasian  female in no acute distress.  HEENT:  Pupils equal round reactive.  Extraocular movements intact.  NECK:  Supple.  Trachea midline.  CHEST:  Clear to auscultation.  ABDOMEN:  Nontender.  PELVIC:  External genitalia multiparous.  Cervix anterior, nontender,  nonpurulent.   GC and Chlamydia done in health department normal.  Pap smear normal at  health department.  Adnexa negative for  masses.   IMPRESSION:  Desire for elective sterilization.   PLAN:  Laparoscopic tubal sterilization, Falope rings.     ___________________________________________                                         Tilda Burrow, M.D.   JVF/MEDQ  D:  06/02/2003  T:  06/02/2003  Job:  [id number]   cc:   Tilda Burrow, M.D.  [address] [place]  [place] [address]  Fax: [phone]   [facility] Department

## 2010-08-09 NOTE — Op Note (Signed)
NAME:  [name], [name]                        ACCOUNT NO.:  [id number]   MEDICAL RECORD NO.:  [id number]                   PATIENT TYPE:  AMB   LOCATION:  DAY                                  FACILITY:  [facility]   PHYSICIAN:  Tilda Burrow, M.D.              DATE OF BIRTH:  03-03-1966   DATE OF PROCEDURE:  06/07/2003  DATE OF DISCHARGE:  06/07/2003                                 OPERATIVE REPORT   PREOPERATIVE DIAGNOSIS:  Elective sterilization.   POSTOPERATIVE DIAGNOSIS:  Elective sterilization.   PROCEDURE:  Laparoscopic tubal sterilization with Falope rings.   SURGEON:  Christin Bach, M.D.   ASSISTANT:  None.   ANESTHESIA:  General.   COMPLICATIONS:  None.   FINDINGS:  Normal uterus, tubes, and ovaries bilaterally.   DETAILS OF PROCEDURE:  The patient was taken to the operating room, prepped  and draped in the usual standard fashion with legs in low lithotomy leg  supports after general anesthesia was introduced without difficulty.  The  bladder was in-and-out catheterized and Hulka tenaculum attached to the  cervix for uterine  manipulation.  An infraumbilical, vertical, 1-cm, skin  incision was made as well as a transverse suprapubic 1-cm incision. A Veress  needle was used to achieve pneumoperitoneum through the umbilical incision  while being careful to orient the needle toward the pelvis while elevating  the abdominal wall by manual elevation. Water droplet test was used to  confirm intraperitoneal placement.   Pneumoperitoneum was achieved easily under 8-to-10 mm of intra-abdominal  pressure; and the laparoscopic trocar was introduced, a 5-mm blunt tipped  trocar, under direct visualization using the video camera.  Peritoneal  cavity was entered without difficulty.  Inspection of the anterior surfaces  of the abdominal contents showed no evidence of injury or bleeding.  Attention was directed to the pelvis.  Findings were as described above.   Attention was  first directed to the left fallopian tube which was elevated,  identified to its fimbriated end and grasped in its midportion with Falope  ring applier.  Falope ring applied and then the tube infiltrated with  Marcaine solution 0.25% using a 22-gauge spinal needle percutaneously  applied.   Attention was then directed to the right fallopian tube where a similar  procedure was performed.  Photo documentation of the ring placements was  performed; 120 cc of saline was instilled into the abdomen; deflation of  CO2 performed; instruments removed and subcuticular 4-0 Dexon closure of  skin incisions performed.  The rest of the surgical instruments were  removed; Steri-Strips placed.  The patient allowed to awaken and go to  recovery room in standard fashion.      ___________________________________________  Tilda Burrow, M.D.   JVF/MEDQ  D:  06/18/2003  T:  06/19/2003  Job:  [id number]

## 2010-12-19 LAB — BASIC METABOLIC PANEL
BUN: 6
BUN: 9
Calcium: 7.2 — ABNORMAL LOW
Chloride: 105
Chloride: 115 — ABNORMAL HIGH
Creatinine, Ser: 0.82
GFR calc non Af Amer: >60
Potassium: 3.9
Sodium: 137

## 2010-12-19 LAB — CBC
HCT: 41.5
Hemoglobin: 14.1
MCV: 100.2 — ABNORMAL HIGH
MCV: 100.6 — ABNORMAL HIGH
Platelets: 160
WBC: 10.8 — ABNORMAL HIGH
WBC: 13.3 — ABNORMAL HIGH

## 2010-12-19 LAB — PREGNANCY, URINE: Preg Test, Ur: NEGATIVE

## 2010-12-19 LAB — URINALYSIS, ROUTINE W REFLEX MICROSCOPIC
Bilirubin Urine: NEGATIVE
Glucose, UA: NEGATIVE
Hgb urine dipstick: NEGATIVE
Leukocytes, UA: NEGATIVE
Nitrite: NEGATIVE
Protein, ur: NEGATIVE
Specific Gravity, Urine: 1.02
Specific Gravity, Urine: <1.005 — ABNORMAL LOW
Urobilinogen, UA: 0.2
pH: 6
pH: 6

## 2010-12-19 LAB — ETHANOL
Alcohol, Ethyl (B): 97 — ABNORMAL HIGH
Alcohol, Ethyl (B): <5

## 2010-12-19 LAB — DIFFERENTIAL
Basophils Relative: 1
Eosinophils Absolute: 0.1
Eosinophils Absolute: 0.1
Eosinophils Relative: 0
Lymphocytes Relative: 25
Lymphs Abs: 3.3
Lymphs Abs: 3.4
Monocytes Absolute: 0.8
Monocytes Relative: 6
Neutro Abs: 6.4
Neutrophils Relative %: 59

## 2010-12-19 LAB — RAPID URINE DRUG SCREEN, HOSP PERFORMED
Cocaine: NOT DETECTED
Opiates: NOT DETECTED
Tetrahydrocannabinol: POSITIVE — AB

## 2010-12-19 LAB — URINE CULTURE

## 2010-12-19 LAB — URINE MICROSCOPIC-ADD ON

## 2010-12-20 LAB — BASIC METABOLIC PANEL
BUN: 8
CO2: 24
Calcium: 8.9
GFR calc non Af Amer: >60
Glucose, Bld: 79
Glucose, Bld: 99
Potassium: 3.5
Potassium: 3.6
Sodium: 138

## 2010-12-20 LAB — DIFFERENTIAL
Basophils Absolute: 0
Basophils Relative: 1
Eosinophils Relative: 1
Monocytes Absolute: 0.6

## 2010-12-20 LAB — CBC
HCT: 33.8 — ABNORMAL LOW
Hemoglobin: 11.7 — ABNORMAL LOW
MCHC: 34.6
MCV: 99.3
RDW: 13.8

## 2010-12-20 LAB — T4, FREE: Free T4: 1.04

## 2010-12-20 LAB — TSH: TSH: 8.829 — ABNORMAL HIGH

## 2010-12-30 LAB — DIFFERENTIAL
Eosinophils Absolute: 0.1 — ABNORMAL LOW
Lymphocytes Relative: 21
Lymphs Abs: 2.3
Monocytes Relative: 6
Neutro Abs: 7.8 — ABNORMAL HIGH
Neutrophils Relative %: 73

## 2010-12-30 LAB — CBC
Platelets: 255
RBC: 4.2
WBC: 10.8 — ABNORMAL HIGH

## 2010-12-30 LAB — BASIC METABOLIC PANEL
BUN: 12
Creatinine, Ser: 0.83
GFR calc Af Amer: >60
GFR calc non Af Amer: >60

## 2010-12-30 LAB — PREGNANCY, URINE: Preg Test, Ur: NEGATIVE

## 2011-12-11 ENCOUNTER — Encounter: Payer: Self-pay | Admitting: *Deleted

## 2016-12-01 ENCOUNTER — Telehealth: Payer: Self-pay | Admitting: Gastroenterology

## 2016-12-01 NOTE — Telephone Encounter (Signed)
Letter mailed to pt.  

## 2016-12-01 NOTE — Telephone Encounter (Signed)
RECALL FOR TCS °

## 2017-01-27 ENCOUNTER — Telehealth: Payer: Self-pay

## 2017-01-27 NOTE — Telephone Encounter (Signed)
Letter reminder for colonoscopy returned and phone number has been disconnected.

## 2020-06-05 ENCOUNTER — Other Ambulatory Visit: Payer: Self-pay | Admitting: Family Medicine

## 2020-06-05 DIAGNOSIS — N6320 Unspecified lump in the left breast, unspecified quadrant: Secondary | ICD-10-CM
